# Patient Record
Sex: Female | Born: 2000 | Race: Black or African American | Hispanic: No | Marital: Single | Smoking: Never smoker
Health system: Southern US, Community
[De-identification: ages and names within clinical notes are randomized; demographics above are authoritative.]

---
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
## 2020-07-09 ENCOUNTER — Emergency Department (HOSPITAL_COMMUNITY): Payer: Medicaid Other

## 2020-07-09 ENCOUNTER — Emergency Department (HOSPITAL_COMMUNITY)
Admission: EM | Admit: 2020-07-09 | Discharge: 2020-07-10 | Disposition: A | Discharge status: Home / Self Care | Payer: Medicaid Other | Attending: Emergency Medicine | Admitting: Emergency Medicine

## 2020-07-09 ENCOUNTER — Other Ambulatory Visit: Payer: Self-pay

## 2020-07-09 DIAGNOSIS — Z5321 Procedure and treatment not carried out due to patient leaving prior to being seen by health care provider: Secondary | ICD-10-CM | POA: Insufficient documentation

## 2020-07-09 DIAGNOSIS — M25571 Pain in right ankle and joints of right foot: Secondary | ICD-10-CM | POA: Diagnosis not present

## 2020-07-09 NOTE — ED Triage Notes (Signed)
Pt was on monkey bars and fell unto R ankle. Reports increased pain on ambulation. Bruising and swelling noted

## 2021-02-26 ENCOUNTER — Encounter: Payer: Self-pay | Admitting: *Deleted

## 2021-02-26 IMAGING — CR DG FOOT COMPLETE 3+V*R*
3 series · 3 of 3 positions shown · non-contrast
Comparison: None.

CLINICAL DATA: Fall from monkey bars.

EXAM:
RIGHT FOOT COMPLETE - 3+ VIEW

[foot ap]
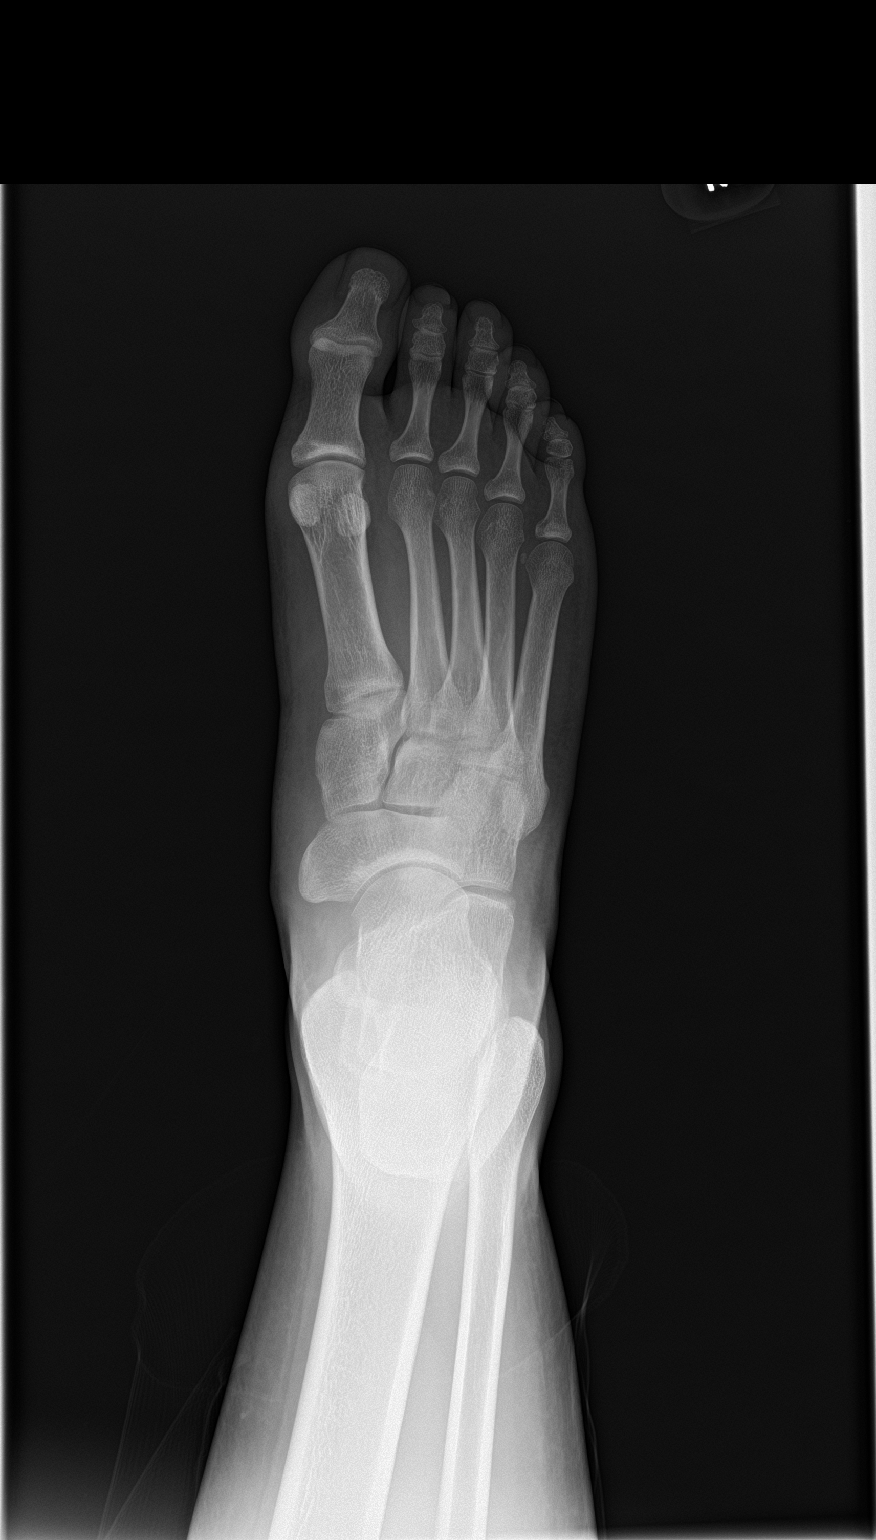

[foot obl]
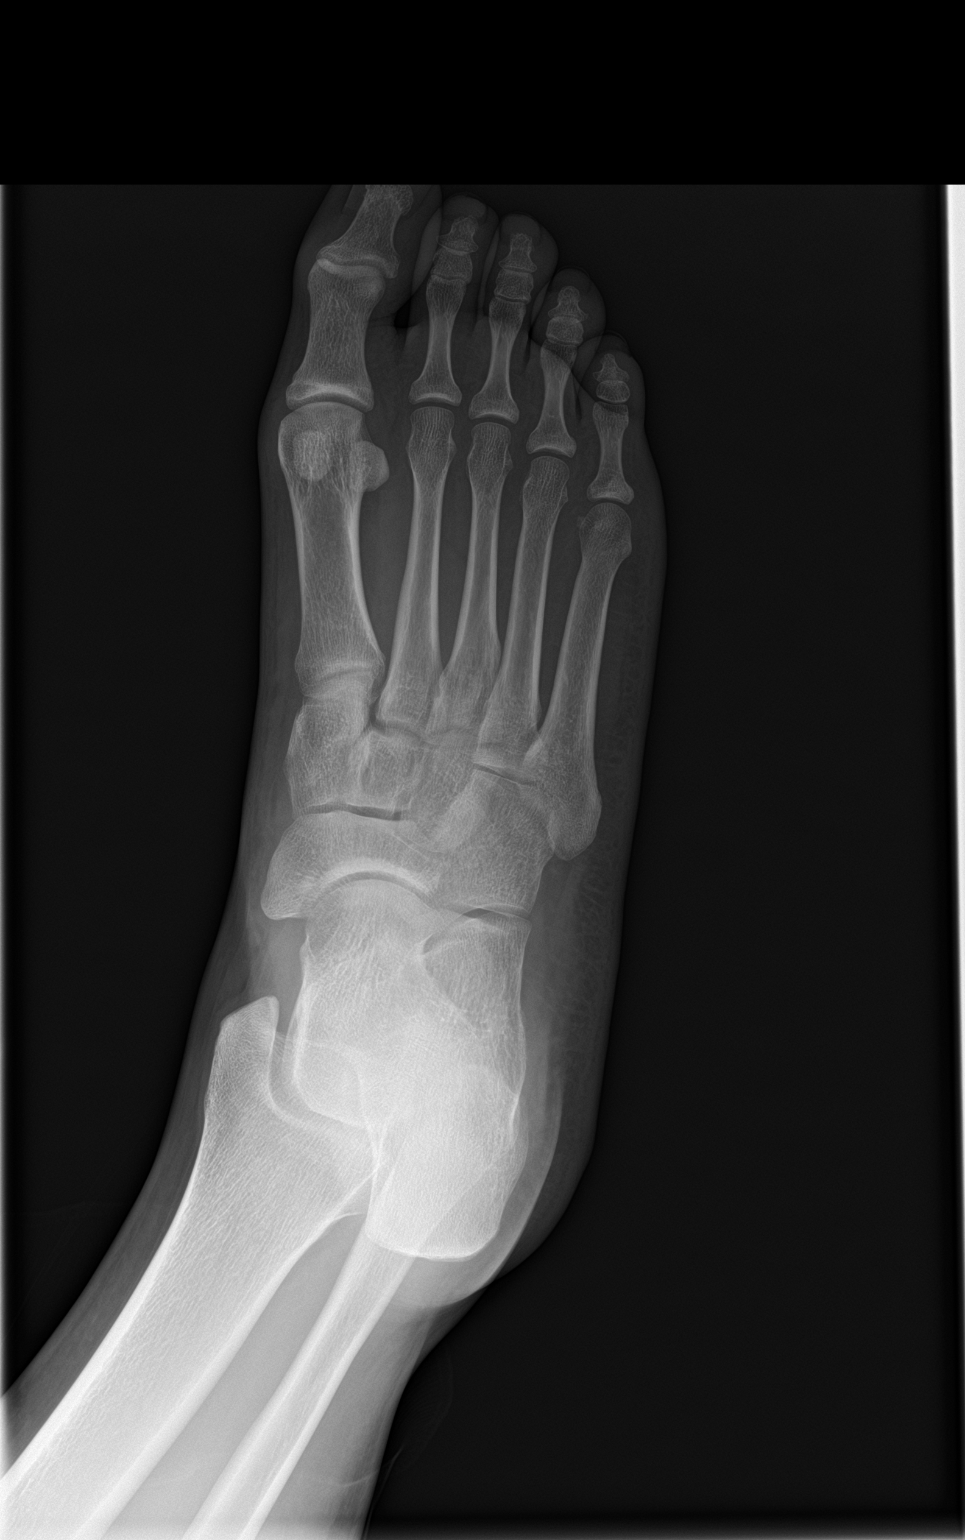

[foot lat]
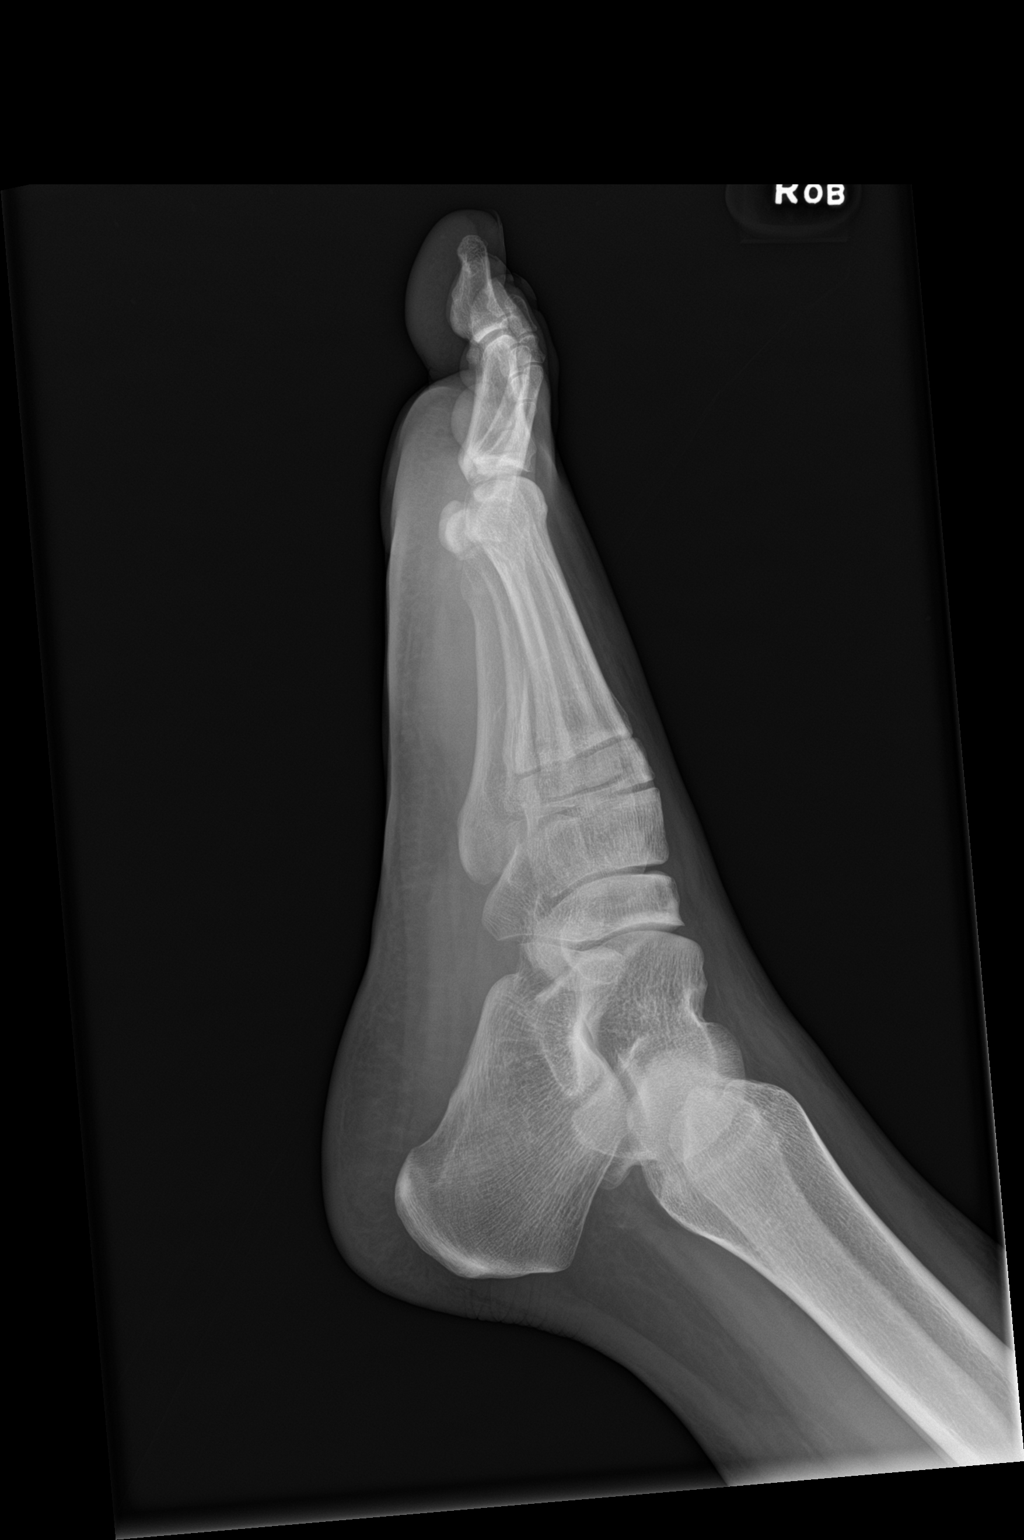

[3 of 3 positions shown; findings below may reference images not displayed]

FINDINGS: There is no evidence of fracture or dislocation. There is no
evidence of arthropathy or other focal bone abnormality. Soft
tissues are unremarkable.
IMPRESSION: Negative radiographs of the right foot.

## 2021-02-26 IMAGING — CR DG TIBIA/FIBULA 2V*R*
4 series · 4 of 4 positions shown · non-contrast
Comparison: None.

CLINICAL DATA: Fall from monkey bars.

EXAM:
RIGHT TIBIA AND FIBULA - 2 VIEW

[tibia ap (1 of 2)]
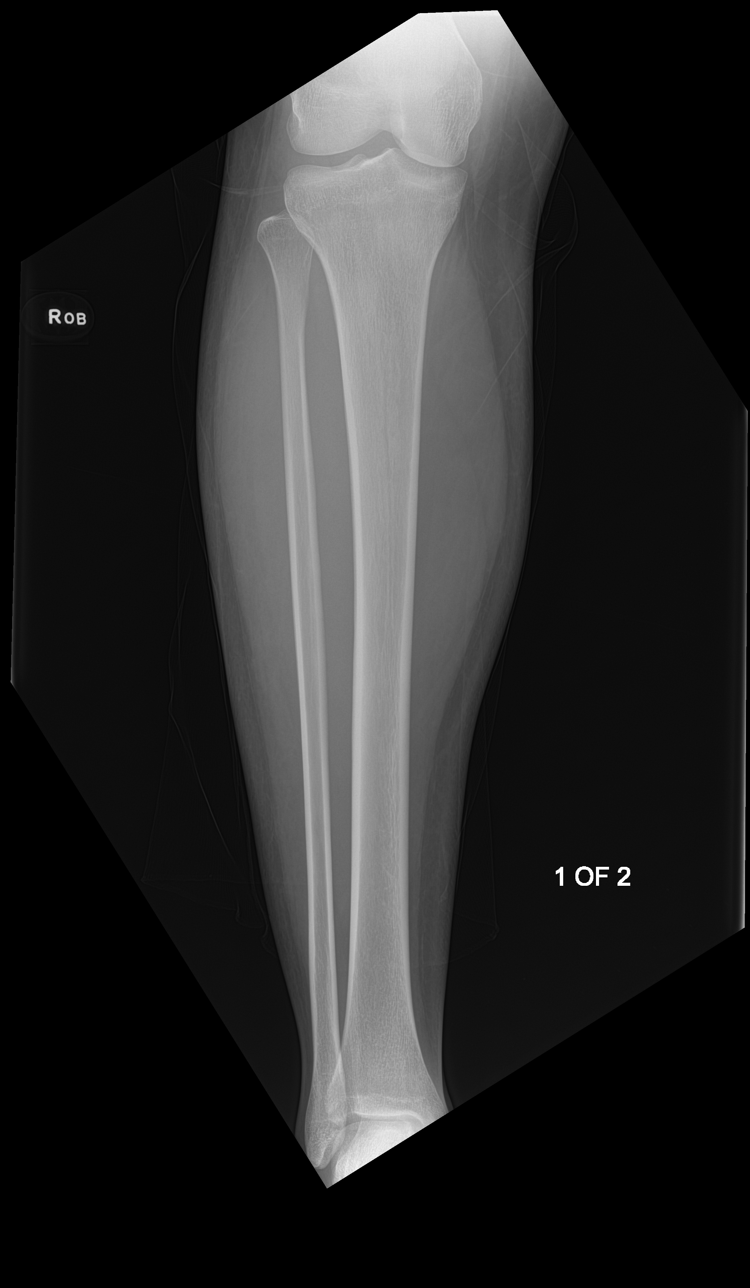

[tibia ap (2 of 2)]
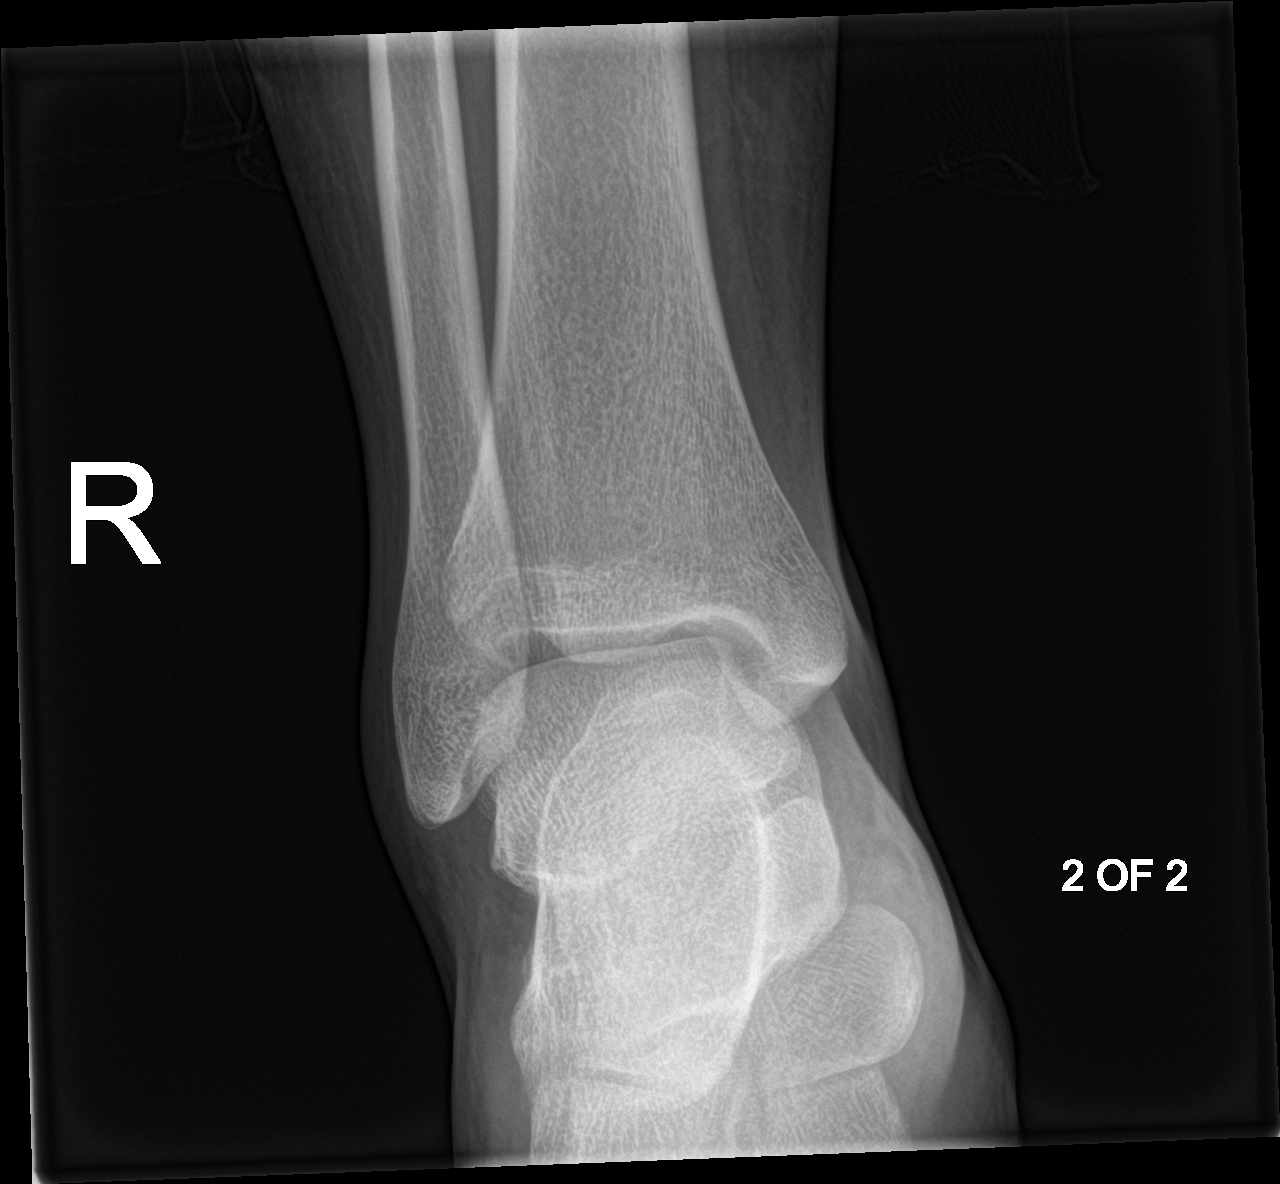

[tibia lat (1 of 2)]
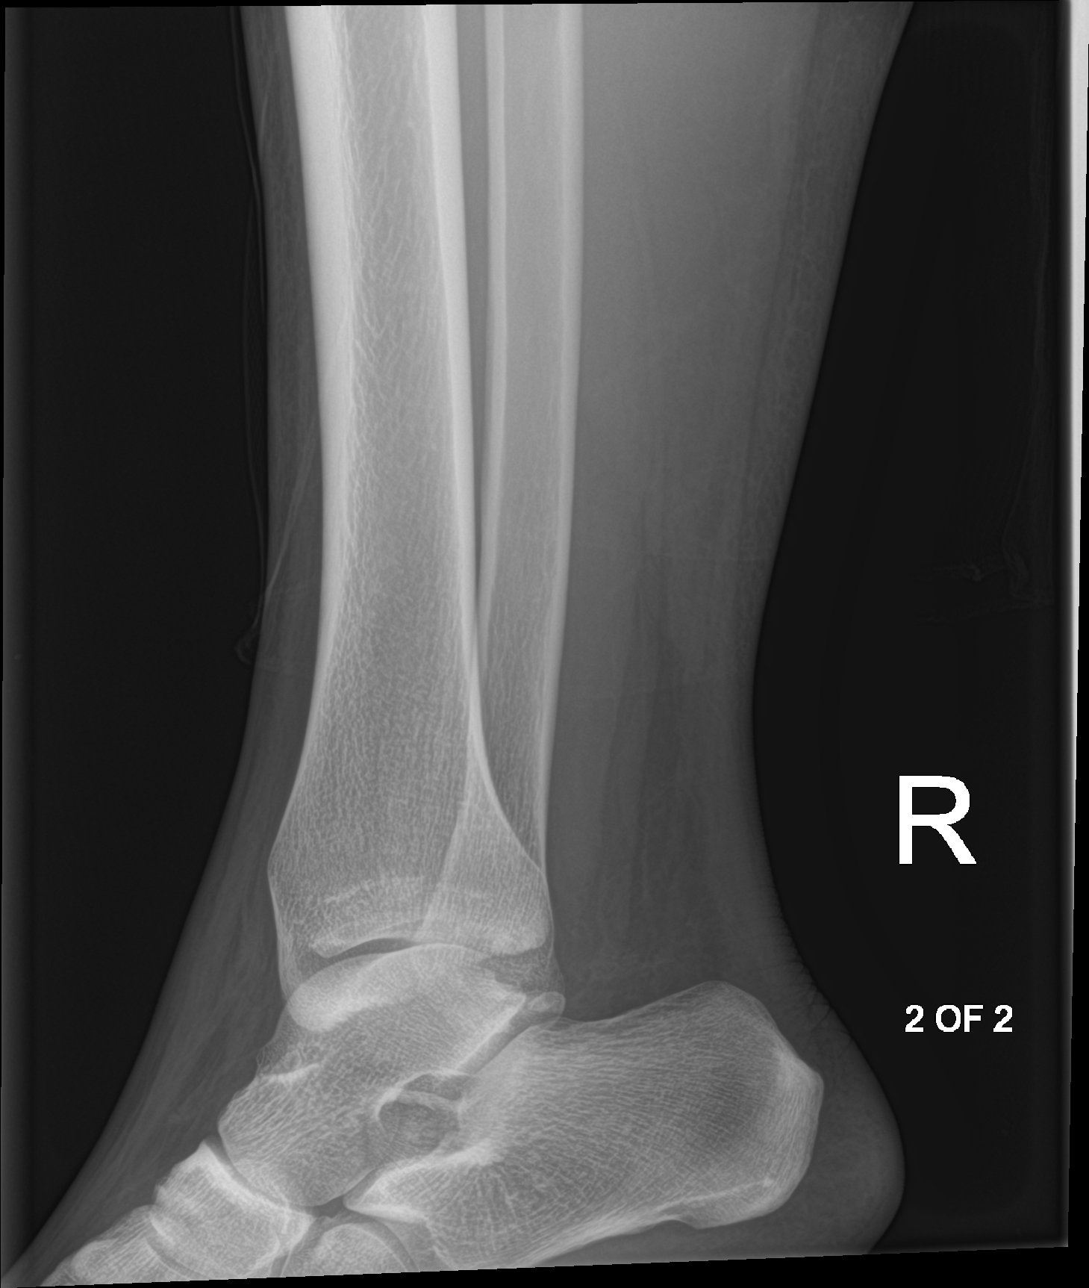

[tibia lat (2 of 2)]
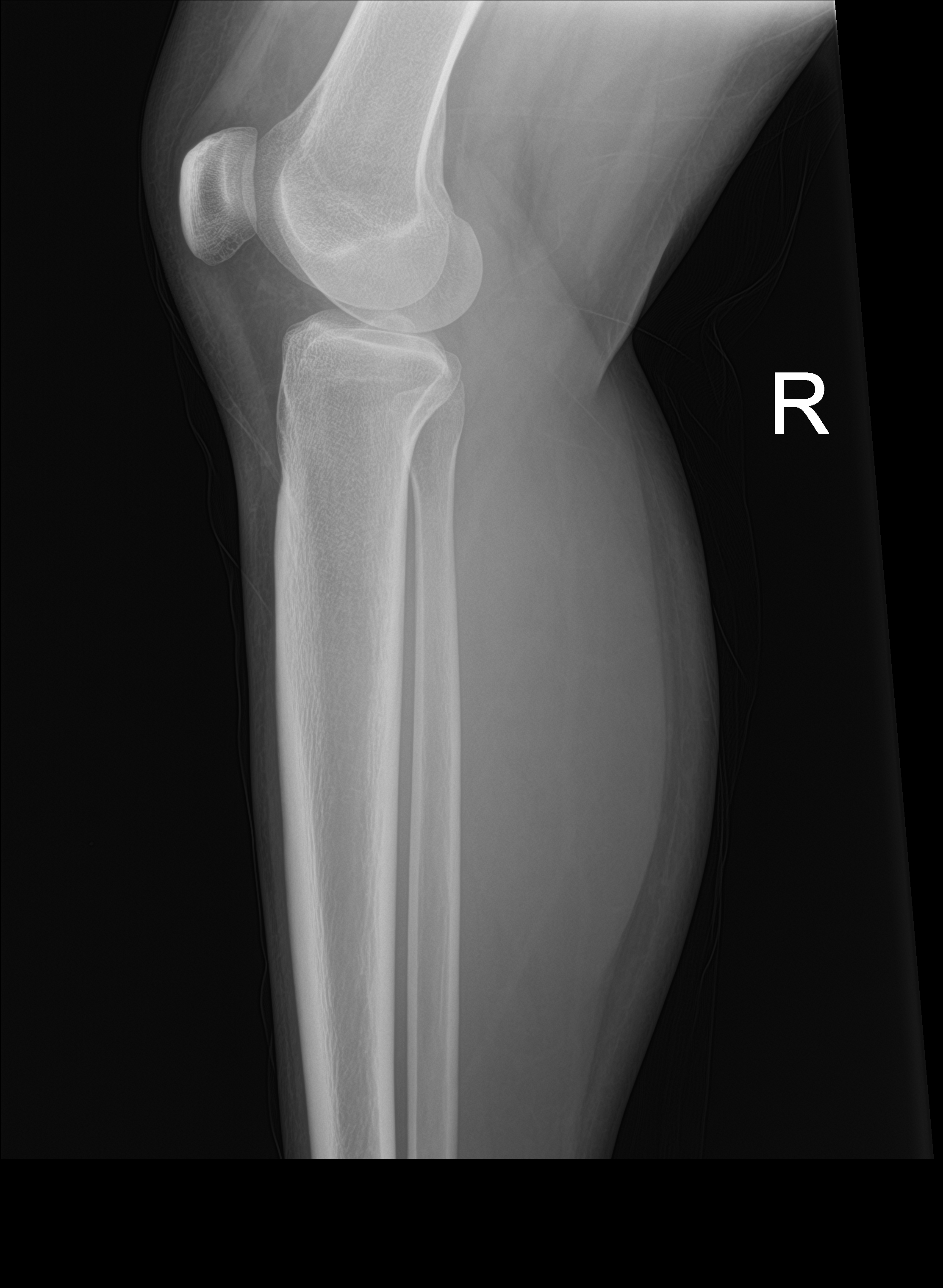

[4 of 4 positions shown; findings below may reference images not displayed]

FINDINGS: Cortical margins of the tibia and fibula are intact. There is no
evidence of fracture or other focal bone lesions. Knee and ankle
alignment are maintained. Soft tissues are unremarkable.
IMPRESSION: Negative radiographs of the right lower leg.

## 2021-03-04 NOTE — Congregational Nurse Program (Signed)
  Dept: [phone]   Congregational Nurse Program Note  Date of Encounter: 02/26/2021  Past Medical History: No past medical history on file.  Encounter Details:  CNP Questionnaire - 02/26/21 1700      Questionnaire   Do you give verbal consent to treat you today? Yes    Visit Setting Church or Organization    Location Patient Served At [facility]    Patient Status Not Applicable    Medical Provider Yes    Insurance Medicaid    Intervention Assess (including screenings)    ED Visit Averted Yes          Client came into [facility] for BP check.  BP 122/76 HR 67.  Client also requested CBG check; BS was 87.  Client is feeling well today with no complaints.  Encouraged client to continue to exercise daily, eat healthy and keep salt intake low.  Client to follow up as needed.  Roderic Palau, RN, MSN, CNP [phone] Office [phone] Cell
